# Patient Record
Sex: Female | Born: 1990 | Race: Asian | Hispanic: No | Marital: Married | Smoking: Never smoker
Health system: Southern US, Community
[De-identification: ages and names within clinical notes are randomized; demographics above are authoritative.]

## PROBLEM LIST (undated history)

## (undated) DIAGNOSIS — N946 Dysmenorrhea, unspecified: Secondary | ICD-10-CM

---

## 2021-08-21 ENCOUNTER — Encounter (HOSPITAL_BASED_OUTPATIENT_CLINIC_OR_DEPARTMENT_OTHER): Payer: Self-pay

## 2021-08-21 ENCOUNTER — Emergency Department (HOSPITAL_BASED_OUTPATIENT_CLINIC_OR_DEPARTMENT_OTHER): Payer: Commercial Managed Care - PPO

## 2021-08-21 ENCOUNTER — Emergency Department (HOSPITAL_BASED_OUTPATIENT_CLINIC_OR_DEPARTMENT_OTHER)
Admit: 2021-08-21 | Discharge: 2021-08-21 | Disposition: A | Discharge status: Home / Self Care | Payer: Commercial Managed Care - PPO | Attending: Emergency Medicine | Admitting: Emergency Medicine

## 2021-08-21 ENCOUNTER — Other Ambulatory Visit: Payer: Self-pay

## 2021-08-21 ENCOUNTER — Emergency Department (HOSPITAL_BASED_OUTPATIENT_CLINIC_OR_DEPARTMENT_OTHER)
Admission: EM | Admit: 2021-08-21 | Discharge: 2021-08-21 | Disposition: A | Discharge status: Home / Self Care | Payer: Commercial Managed Care - PPO | Attending: Emergency Medicine | Admitting: Emergency Medicine

## 2021-08-21 DIAGNOSIS — R109 Unspecified abdominal pain: Secondary | ICD-10-CM | POA: Diagnosis present

## 2021-08-21 DIAGNOSIS — M545 Low back pain, unspecified: Secondary | ICD-10-CM | POA: Insufficient documentation

## 2021-08-21 HISTORY — DX: Dysmenorrhea, unspecified: N94.6

## 2021-08-21 LAB — BASIC METABOLIC PANEL
Anion gap: 6 (ref 5–15)
BUN: 22 mg/dL — ABNORMAL HIGH (ref 6–20)
CO2: 23 mmol/L (ref 22–32)
Calcium: 8.9 mg/dL (ref 8.9–10.3)
Chloride: 106 mmol/L (ref 98–111)
Creatinine, Ser: 0.81 mg/dL (ref 0.44–1.00)
GFR, Estimated: >60 mL/min (ref >60)
Glucose, Bld: 102 mg/dL — ABNORMAL HIGH (ref 70–99)
Potassium: 3.5 mmol/L (ref 3.5–5.1)
Sodium: 135 mmol/L (ref 135–145)

## 2021-08-21 LAB — CBC WITH DIFFERENTIAL/PLATELET
Abs Immature Granulocytes: 0.02 10*3/uL (ref 0.00–0.07)
Basophils Absolute: 0 10*3/uL (ref 0.0–0.1)
Basophils Relative: 1 %
Eosinophils Absolute: 0.2 10*3/uL (ref 0.0–0.5)
Eosinophils Relative: 3 %
HCT: 37.1 % (ref 36.0–46.0)
Hemoglobin: 12.3 g/dL (ref 12.0–15.0)
Immature Granulocytes: 0 %
Lymphocytes Relative: 30 %
Lymphs Abs: 1.8 10*3/uL (ref 0.7–4.0)
MCH: 30 pg (ref 26.0–34.0)
MCHC: 33.2 g/dL (ref 30.0–36.0)
MCV: 90.5 fL (ref 80.0–100.0)
Monocytes Absolute: 0.4 10*3/uL (ref 0.1–1.0)
Monocytes Relative: 6 %
Neutro Abs: 3.6 10*3/uL (ref 1.7–7.7)
Neutrophils Relative %: 60 %
Platelets: 159 10*3/uL (ref 150–400)
RBC: 4.1 MIL/uL (ref 3.87–5.11)
RDW: 13 % (ref 11.5–15.5)
WBC: 6 10*3/uL (ref 4.0–10.5)
nRBC: 0 % (ref 0.0–0.2)

## 2021-08-21 LAB — URINALYSIS, ROUTINE W REFLEX MICROSCOPIC
Bilirubin Urine: NEGATIVE
Glucose, UA: NEGATIVE mg/dL
Ketones, ur: NEGATIVE mg/dL
Leukocytes,Ua: NEGATIVE
Nitrite: NEGATIVE
Protein, ur: NEGATIVE mg/dL
Specific Gravity, Urine: >=1.03 (ref 1.005–1.030)
pH: 6.5 (ref 5.0–8.0)

## 2021-08-21 LAB — URINALYSIS, MICROSCOPIC (REFLEX): RBC / HPF: >50 RBC/hpf (ref 0–5)

## 2021-08-21 LAB — PREGNANCY, URINE: Preg Test, Ur: NEGATIVE

## 2021-08-21 MED ORDER — OXYCODONE-ACETAMINOPHEN 5-325 MG PO TABS: 1.0000 | ORAL_TABLET | Freq: Three times a day (TID) | ORAL | 0 refills | Status: AC | PRN

## 2021-08-21 NOTE — ED Notes (Signed)
Pt in bed, sig other at bedside, pt states that she has no pain and is ready to go home, talked with pt and ultrasound, set up ultrasound appointment for 3pm today, pt verbalized understanding to return for ultrasound at 3.  Pt ambulatory from dpt with sig other.

## 2021-08-21 NOTE — ED Provider Notes (Signed)
[facility] EMERGENCY DEPARTMENT Provider Note   CSN: [id number] Arrival date & time: 08/21/21  0458     History  Chief Complaint  Patient presents with   Abdominal Pain    LLQ   Flank Pain    Left    Glenda Decker is a 31 y.o. female.  31 yo F who presents to the emergency department tonight secondary to left lower back pain that radiates around to her stomach.  Patient states that this happened on Tuesday.  She states for about 40 minutes she had to rest until the pain went away.  She took ibuprofen during that time which seemed to help.  She was fine since that time.  She did start her menstrual cycle a day prior to that and has been heavier than normal.  She called her gynecologic nurse practitioner who set up an ultrasound for the beginning of March.  Patient states that tonight around 230 the pain returned and she took 3 ibuprofen again which ultimately helped but she was still having pain for couple hours.  She stated it was severe.  No other vaginal changes.  No urinary changes.  No history of kidney stones.  No trauma.  No musculoskeletal injuries that she knows of.  She does state that sometimes it would radiate down her leg but not consistently.  It is a pinching type feeling.   Abdominal Pain Flank Pain Associated symptoms include abdominal pain.      Home Medications Prior to Admission medications   Medication Sig Start Date End Date Taking? Authorizing Provider  oxyCODONE-acetaminophen (PERCOCET) 5-325 MG tablet Take 1 tablet by mouth every 8 (eight) hours as needed. 08/21/21  Yes Yadira Hada, Barbara Cower, MD      Allergies    Patient has no known allergies.    Review of Systems   Review of Systems  Gastrointestinal:  Positive for abdominal pain.  Genitourinary:  Positive for flank pain.   Physical Exam Updated Vital Signs BP 97/73    Pulse 62    Temp 98.3 F (36.8 C) (Oral)    Resp 18    Ht 5\' 1"  (1.549 m)    Wt 59 kg    LMP 08/18/2021    SpO2 99%    BMI  24.56 kg/m  Physical Exam Vitals and nursing note reviewed.  Constitutional:      Appearance: She is well-developed.  HENT:     Head: Normocephalic and atraumatic.     Mouth/Throat:     Mouth: Mucous membranes are moist.     Pharynx: Oropharynx is clear.  Eyes:     Pupils: Pupils are equal, round, and reactive to light.  Cardiovascular:     Rate and Rhythm: Normal rate and regular rhythm.  Pulmonary:     Effort: No respiratory distress.     Breath sounds: No stridor.  Abdominal:     General: Abdomen is flat. There is no distension.     Tenderness: There is no abdominal tenderness.  Musculoskeletal:        General: No swelling or tenderness. Normal range of motion.     Cervical back: Normal range of motion.  Skin:    General: Skin is warm and dry.  Neurological:     General: No focal deficit present.     Mental Status: She is alert.    ED Results / Procedures / Treatments   Labs (all labs ordered are listed, but only abnormal results are displayed) Labs Reviewed  URINALYSIS,  ROUTINE W REFLEX MICROSCOPIC - Abnormal; Notable for the following components:      Result Value   APPearance CLOUDY (*)    Hgb urine dipstick LARGE (*)    All other components within normal limits  URINALYSIS, MICROSCOPIC (REFLEX) - Abnormal; Notable for the following components:   Bacteria, UA RARE (*)    All other components within normal limits  BASIC METABOLIC PANEL - Abnormal; Notable for the following components:   Glucose, Bld 102 (*)    BUN 22 (*)    All other components within normal limits  URINE CULTURE  PREGNANCY, URINE  CBC WITH DIFFERENTIAL/PLATELET    EKG None  Radiology CT Renal Stone Study  Result Date: 08/21/2021 CLINICAL DATA:  31 year old female with history of left lower quadrant abdominal pain and flank pain. EXAM: CT ABDOMEN AND PELVIS WITHOUT CONTRAST TECHNIQUE: Multidetector CT imaging of the abdomen and pelvis was performed following the standard protocol without  IV contrast. RADIATION DOSE REDUCTION: This exam was performed according to the departmental dose-optimization program which includes automated exposure control, adjustment of the mA and/or kV according to patient size and/or use of iterative reconstruction technique. COMPARISON:  No priors. FINDINGS: Lower chest: Bilateral breast implants are incidentally noted. Hepatobiliary: No suspicious cystic or solid hepatic lesions are confidently identified on today's noncontrast CT examination. Unenhanced appearance of the gallbladder is normal. Pancreas: No definite pancreatic mass or peripancreatic fluid collections or inflammatory changes are noted on today's noncontrast CT examination. Spleen: Unremarkable. Adrenals/Urinary Tract: There are no abnormal calcifications within the collecting system of either kidney, along the course of either ureter, or within the lumen of the urinary bladder. No hydroureteronephrosis or perinephric stranding to suggest urinary tract obstruction at this time. The unenhanced appearance of the kidneys is unremarkable bilaterally. Unenhanced appearance of the urinary bladder is normal. Bilateral adrenal glands are normal in appearance. Stomach/Bowel: Unenhanced appearance of the stomach is normal. There is no pathologic dilatation of small bowel or colon. Normal appendix. Vascular/Lymphatic: No atherosclerotic calcifications are noted in the abdominal aorta or pelvic vasculature. No lymphadenopathy noted in the abdomen or pelvis. Reproductive: Unenhanced appearance of the uterus and ovaries is unremarkable. Other: Trace amount of free fluid in the cul-de-sac, presumably physiologic in this young female patient. No larger volume of ascites. No pneumoperitoneum. Musculoskeletal: There are no aggressive appearing lytic or blastic lesions noted in the visualized portions of the skeleton. IMPRESSION: 1. No acute findings are noted in the abdomen or pelvis to account for the patient's symptoms. 2.  Trace volume of free fluid in the cul-de-sac, presumably physiologic in this young female patient. Electronically Signed   By: Trudie Reed M.D.   On: 08/21/2021 06:51    Procedures Procedures    Medications Ordered in ED Medications - No data to display  ED Course/ Medical Decision Making/ A&P                           Medical Decision Making Amount and/or Complexity of Data Reviewed Labs: ordered. Radiology: ordered. ECG/medicine tests: ordered.  Risk Prescription drug management.   Initial concern for kidney stone versus ovarian cyst versus sciatica.  Urine appears to be slightly contaminated with her menstrual blood, culture sent.  Labs unremarkable.  CT scan without any evidence of stone, musculoskeletal causes.  Patient's symptoms are controlled this time.  Doubt ovarian torsion although I guess it could be intermittent.  Could be a small cyst not seen on the  CT or possibly when it is already ruptured.  Could be muscular as well.  Also still could be sciatica.  We will treat conservatively at home and return for an ultrasound to evaluate for cyst otherwise follow-up with her outpatient doctors.  Final Clinical Impression(s) / ED Diagnoses Final diagnoses:  Flank pain    Rx / DC Orders ED Discharge Orders          Ordered    oxyCODONE-acetaminophen (PERCOCET) 5-325 MG tablet  Every 8 hours PRN        08/21/21 0707    US PELVIC COMPLETE W TRANSVAGINAL AND TORSION R/O        08/21/21 0708              Cledith Kamiya, Barbara Cower, MD 08/21/21 [phone]

## 2021-08-21 NOTE — ED Triage Notes (Signed)
Patient presents with complaint of LLQ pain and L flank pain which began around 0230 this morning.  Patient took 3 advil without relief of symptoms.  Denies nausea/vomiting.  Patient having light menstrual bleeding which began on Tuesday.

## 2021-08-22 LAB — URINE CULTURE: Culture: <10000 — AB

## 2023-02-13 IMAGING — CT CT RENAL STONE PROTOCOL
2 of 4 series · 16 of 46 positions shown, 18 images · non-contrast
Comparison: No priors.

CLINICAL DATA: 30-year-old female with history of left lower
quadrant abdominal pain and flank pain.



[Series 2: axial st · axial · 0.84mm/px · z∈[+639,+1049]mm · 13 of 90 slices shown, 15 images]
[im 4/90  soft-tissue]
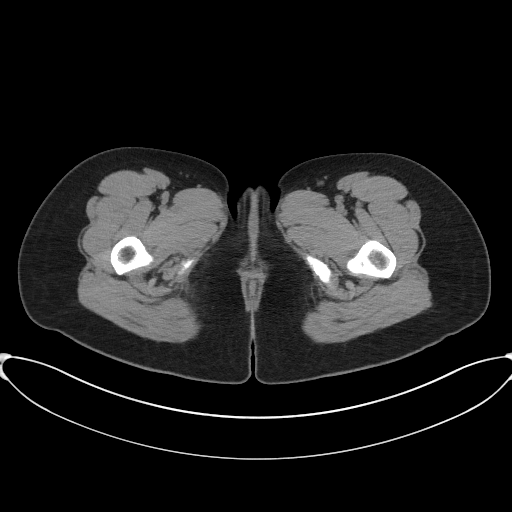
[im 4/90  bone]
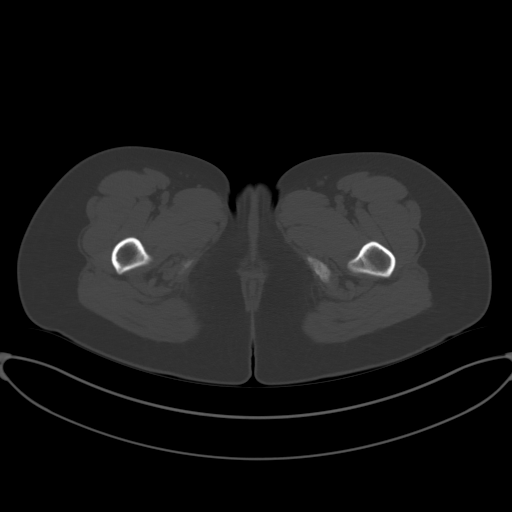
[im 11/90  soft-tissue]
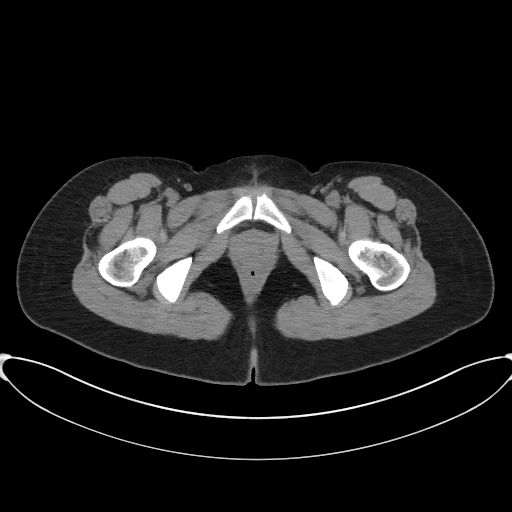
[im 18/90  soft-tissue]
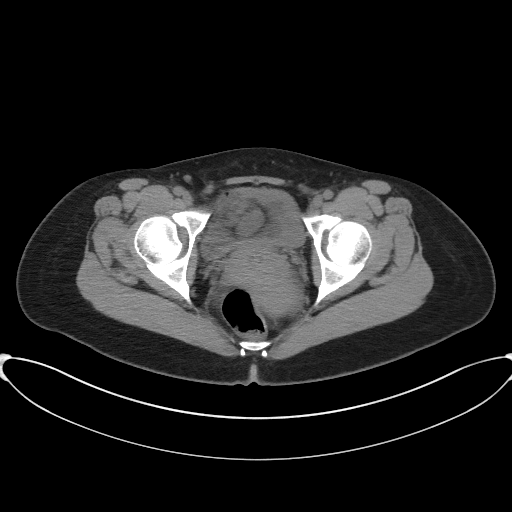
[im 25/90  soft-tissue]
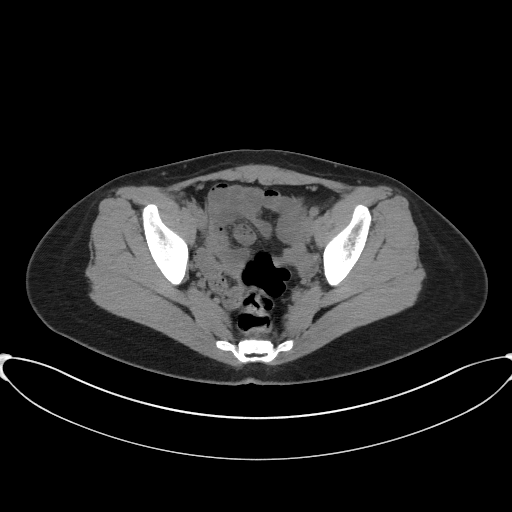
[im 33/90  soft-tissue]
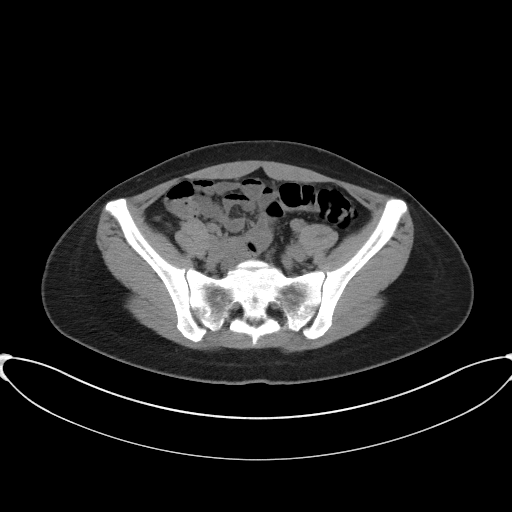
[im 40/90  soft-tissue]
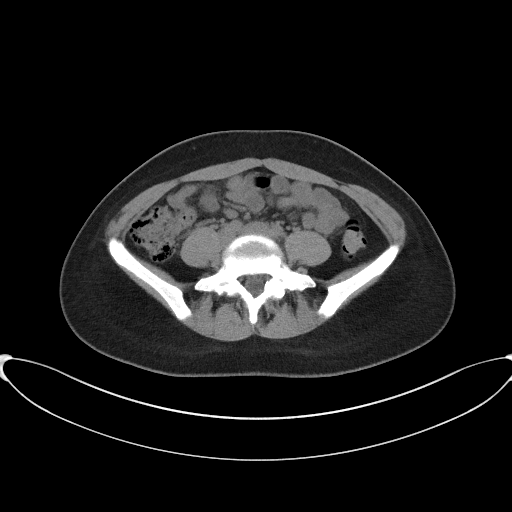
[im 47/90  soft-tissue]
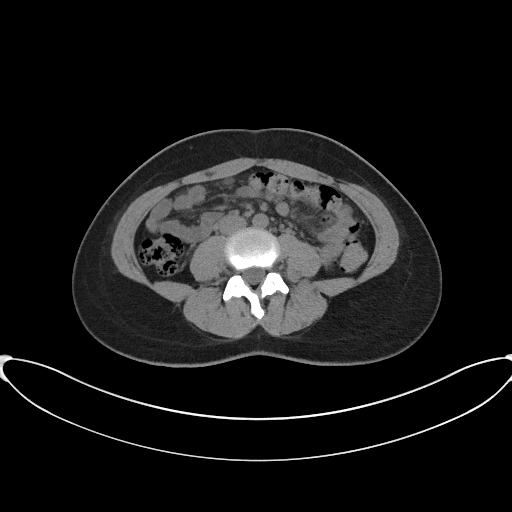
[im 50/90  soft-tissue]
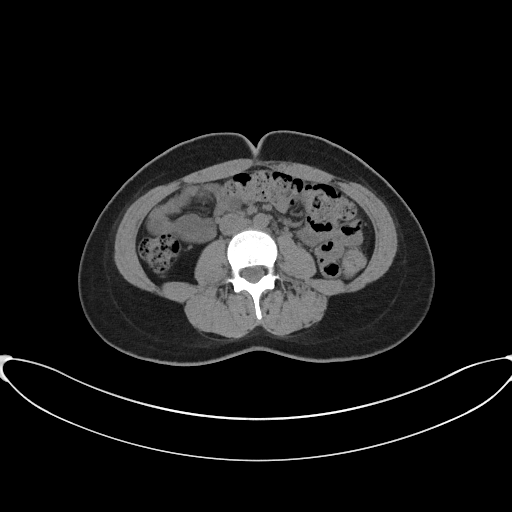
[im 57/90  soft-tissue]
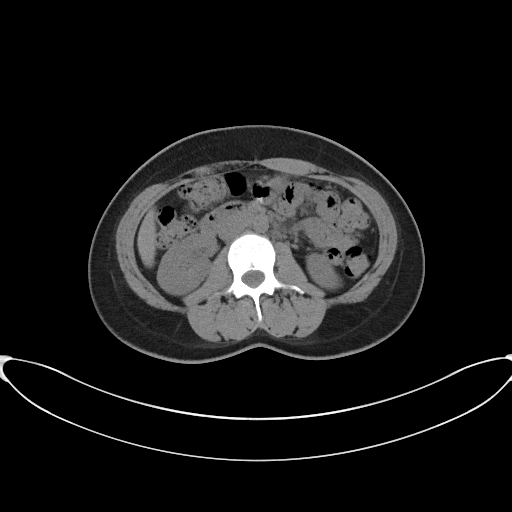
[im 57/90  bone]
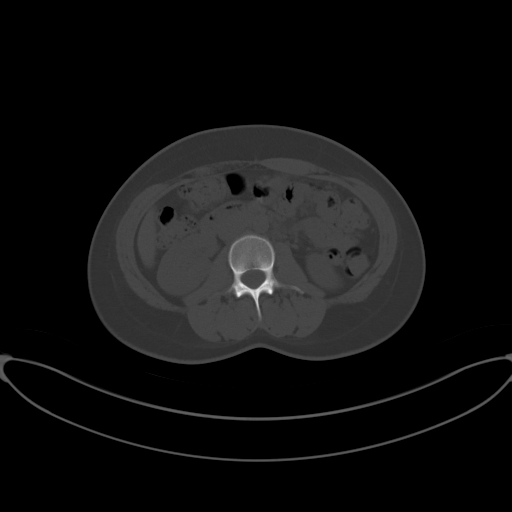
[im 65/90  soft-tissue]
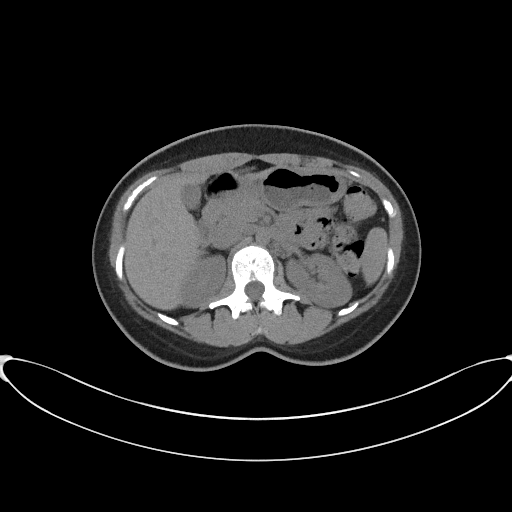
[im 72/90  soft-tissue]
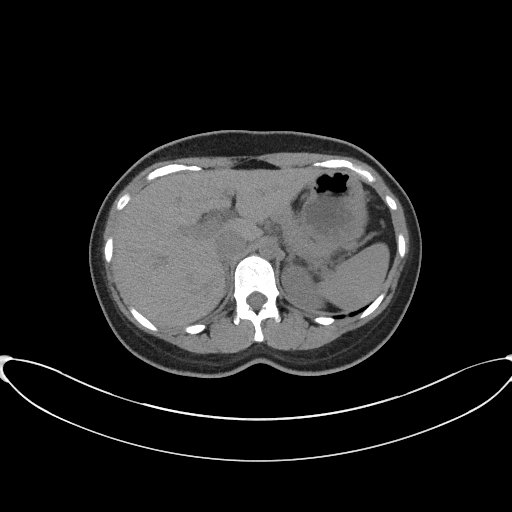
[im 79/90  soft-tissue]
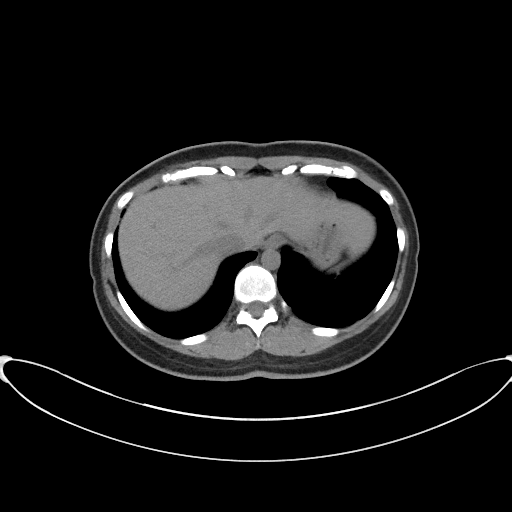
[im 86/90  soft-tissue]
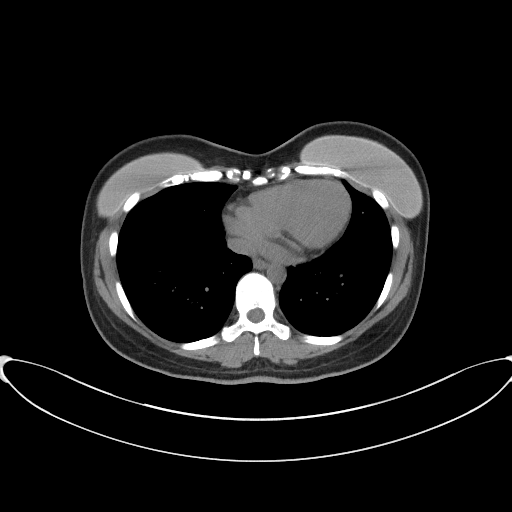

[Series 5: coronal st · coronal · 0.71mm/px · 3 of 68 slices shown]
[im 23/68  soft-tissue]
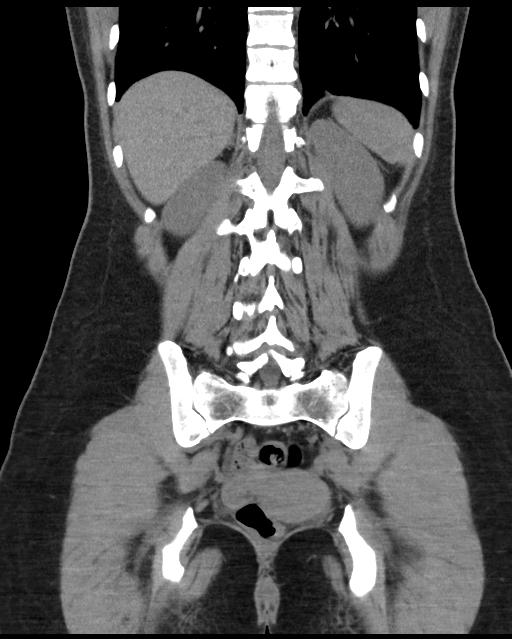
[im 30/68  soft-tissue]
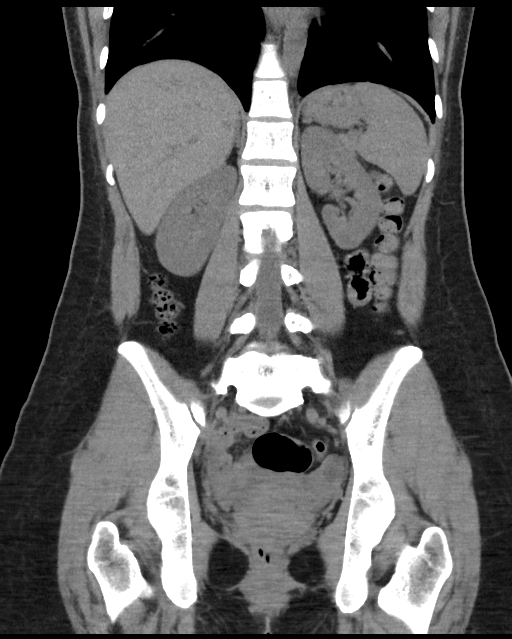
[im 38/68  soft-tissue]
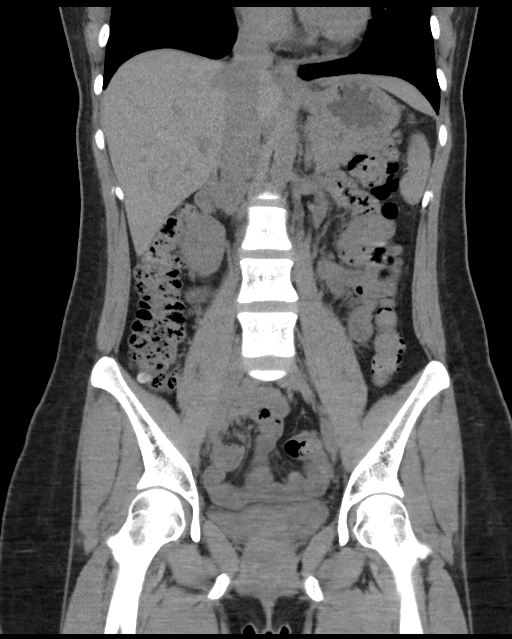

[16 of 46 positions shown; findings below may reference images not displayed]

FINDINGS: Lower chest: Bilateral breast implants are incidentally noted.

Hepatobiliary: No suspicious cystic or solid hepatic lesions are
confidently identified on today's noncontrast CT examination.
Unenhanced appearance of the gallbladder is normal.

Pancreas: No definite pancreatic mass or peripancreatic fluid
collections or inflammatory changes are noted on today's noncontrast
CT examination.

Spleen: Unremarkable.

Adrenals/Urinary Tract: There are no abnormal calcifications within
the collecting system of either kidney, along the course of either
ureter, or within the lumen of the urinary bladder. No
hydroureteronephrosis or perinephric stranding to suggest urinary
tract obstruction at this time. The unenhanced appearance of the
kidneys is unremarkable bilaterally. Unenhanced appearance of the
urinary bladder is normal. Bilateral adrenal glands are normal in
appearance.

Stomach/Bowel: Unenhanced appearance of the stomach is normal. There
is no pathologic dilatation of small bowel or colon. Normal
appendix.

Vascular/Lymphatic: No atherosclerotic calcifications are noted in
the abdominal aorta or pelvic vasculature. No lymphadenopathy noted
in the abdomen or pelvis.

Reproductive: Unenhanced appearance of the uterus and ovaries is
unremarkable.

Other: Trace amount of free fluid in the cul-de-sac, presumably
physiologic in this young female patient. No larger volume of
ascites. No pneumoperitoneum.

Musculoskeletal: There are no aggressive appearing lytic or blastic
lesions noted in the visualized portions of the skeleton.
IMPRESSION: 1. No acute findings are noted in the abdomen or pelvis to account
for the patient's symptoms.
2. Trace volume of free fluid in the cul-de-sac, presumably
physiologic in this young female patient.

## 2023-02-13 IMAGING — US US PELVIS COMPLETE TRANSABD/TRANSVAG W DUPLEX AND/OR DOPPLER
1 series · 13 of 25 positions shown · non-contrast
Comparison: CT abdomen and pelvis 08/21/2021

CLINICAL DATA: LEFT flank and pelvic pain, LMP 08/18/2021

EXAM:
TRANSABDOMINAL AND TRANSVAGINAL ULTRASOUND OF PELVIS
DOPPLER ULTRASOUND OF OVARIES
TECHNIQUE: Both transabdominal and transvaginal ultrasound examinations of the
pelvis were performed. Transabdominal technique was performed for
global imaging of the pelvis including uterus, ovaries, adnexal
regions, and pelvic cul-de-sac.
It was necessary to proceed with endovaginal exam following the
transabdominal exam to visualize the uterus, endometrium, and
ovaries. Color and duplex Doppler ultrasound was utilized to
evaluate blood flow to the ovaries.

[Series 1: us pelvis complete transabd/transvag w duplex and/ · 13 of 112 slices shown]
[im 1/112]
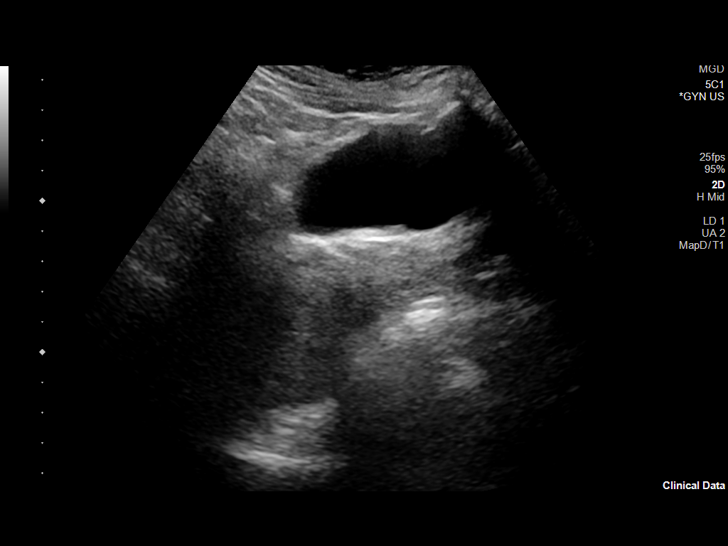
[im 10/112]
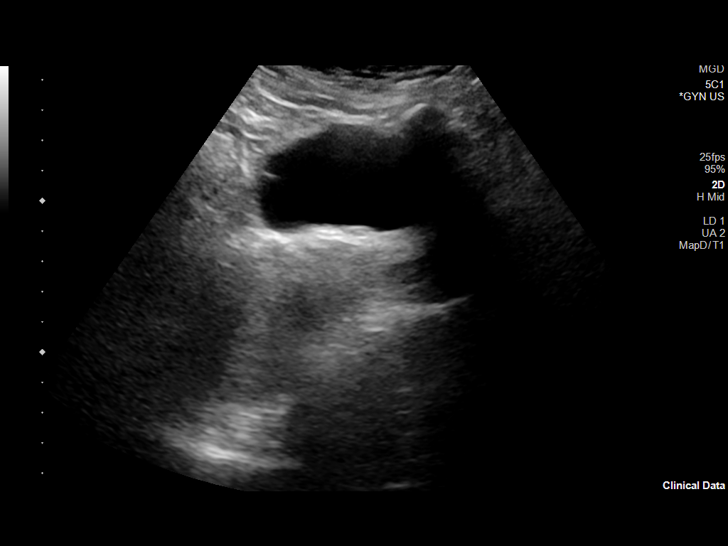
[im 19/112]
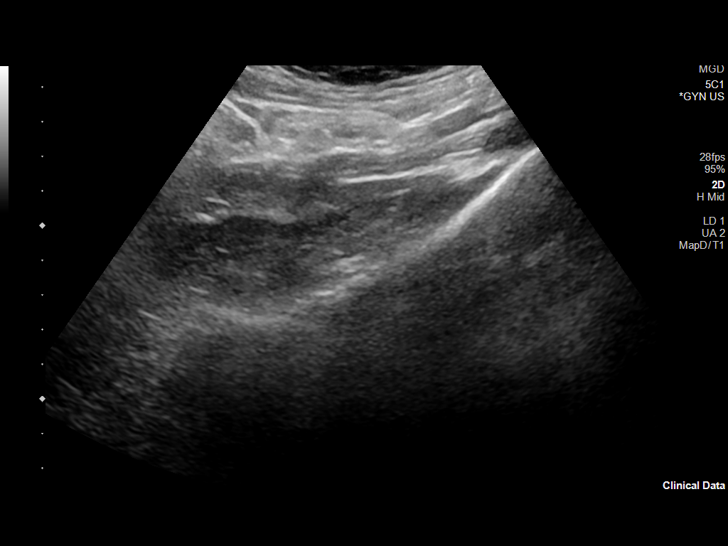
[im 28/112]
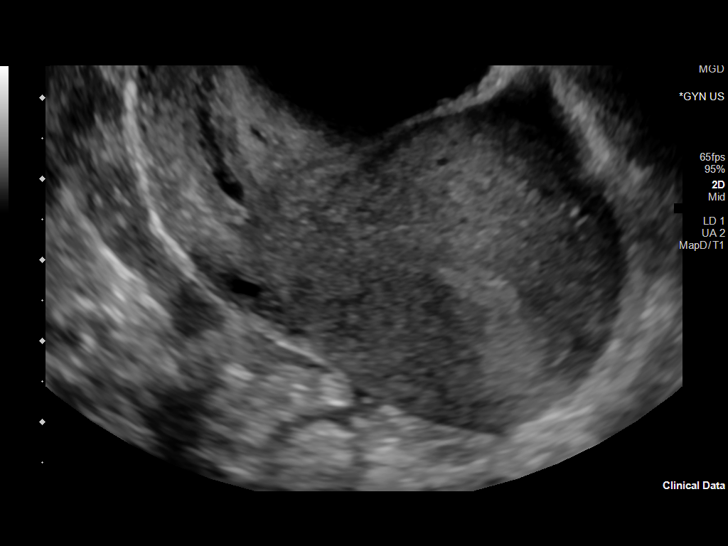
[im 38/112]
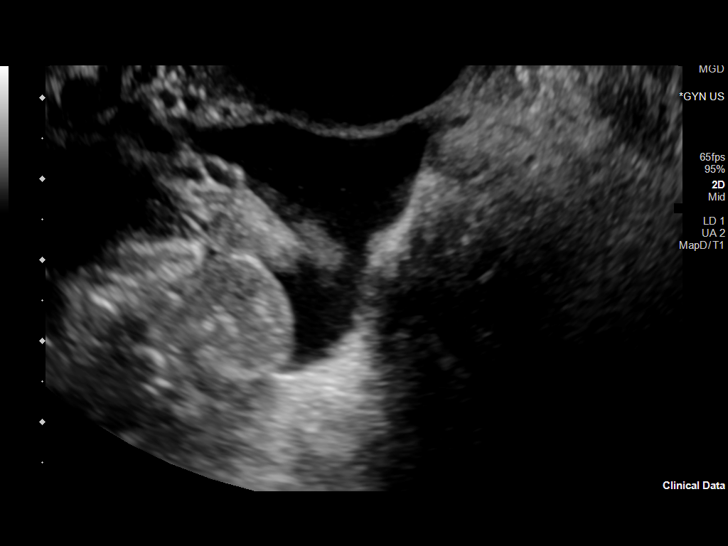
[im 47/112]
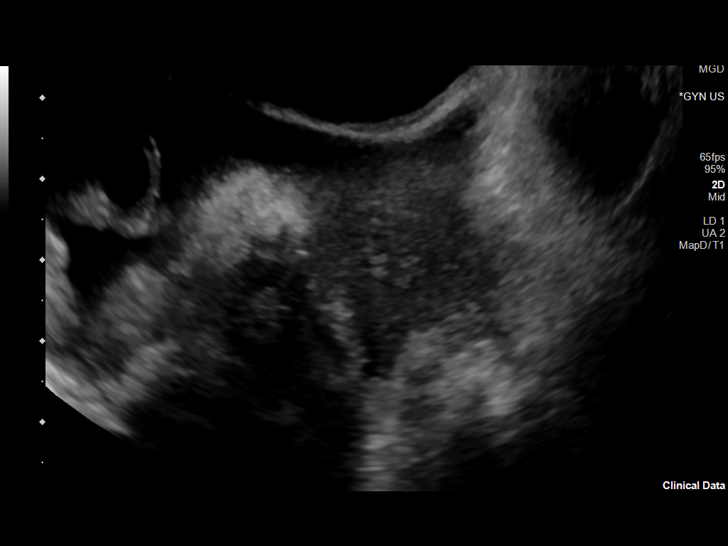
[im 56/112]
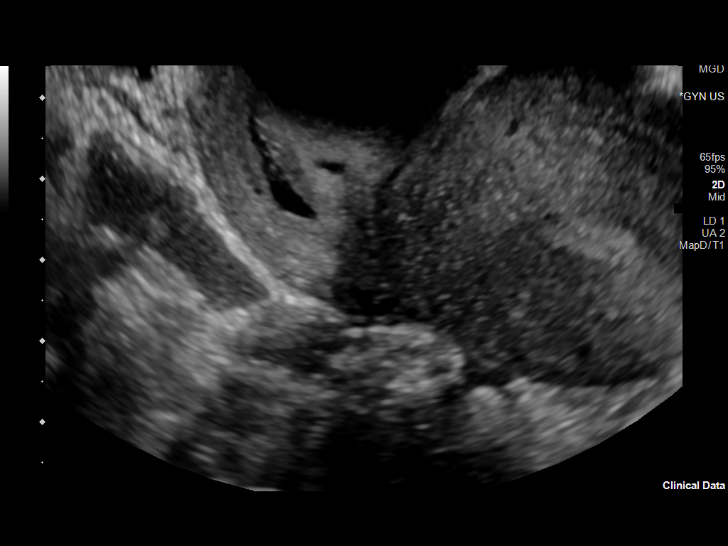
[im 65/112]
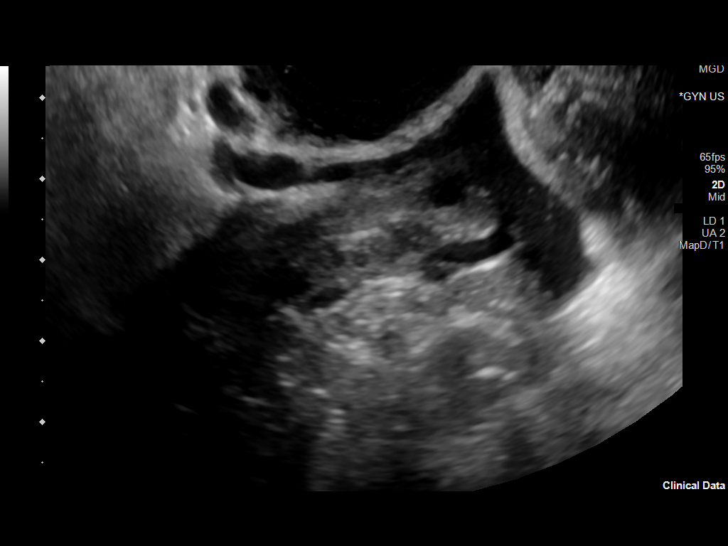
[im 75/112]
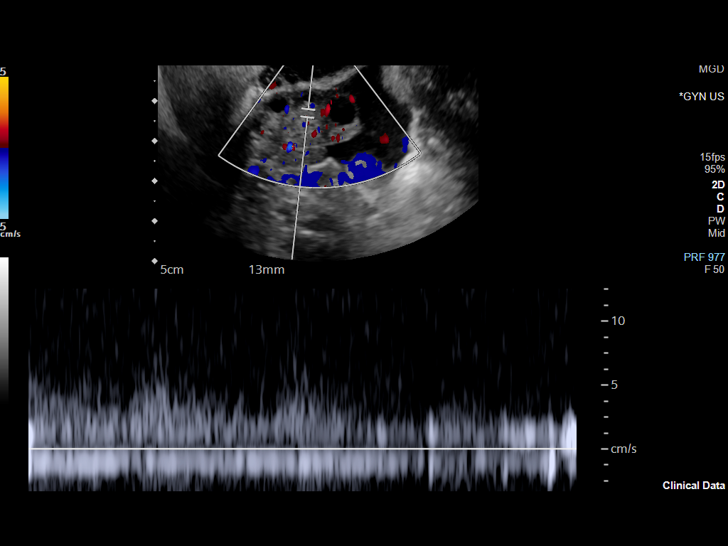
[im 84/112]
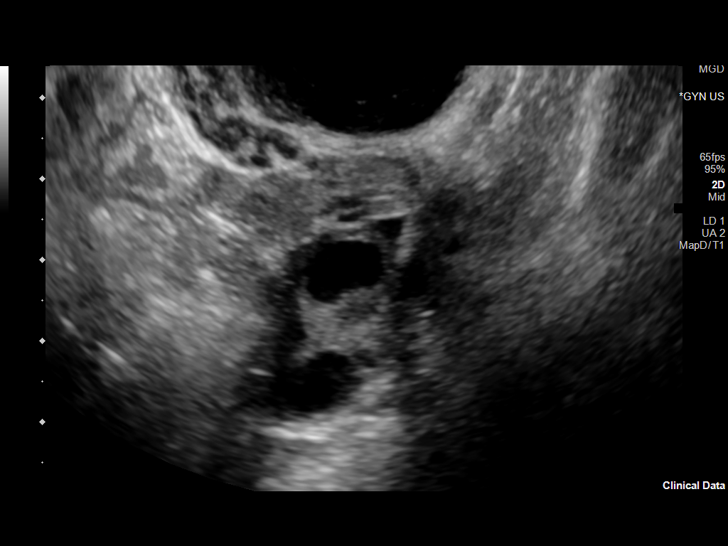
[im 93/112]
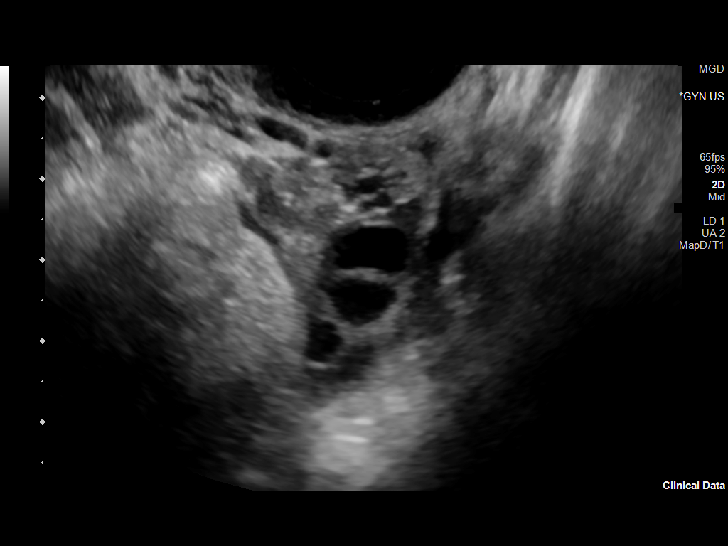
[im 102/112]
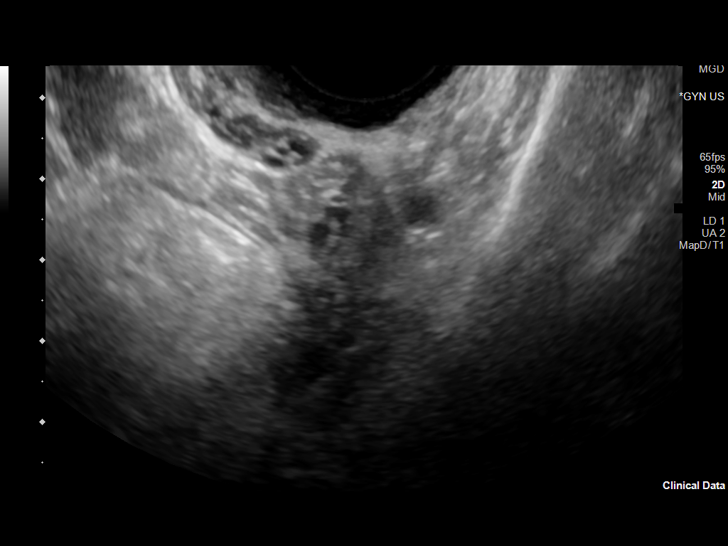
[im 112/112]
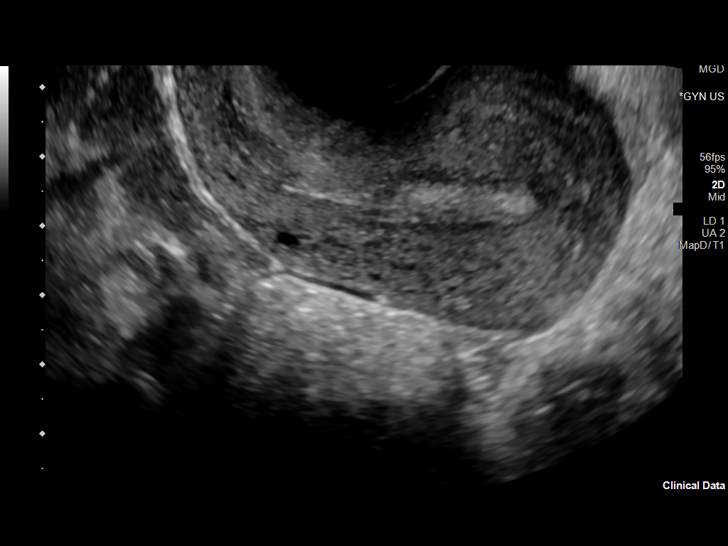

[13 of 25 positions shown; findings below may reference images not displayed]

FINDINGS: Uterus

Measurements: 6.4 x 3.7 x 4.7 cm = volume: 59 mL. Retroverted.
Normal morphology without mass

Endometrium

Thickness: 5 mm. No endometrial fluid or mass. Small amount of
nonspecific endocervical canal fluid noted.

Right ovary

Measurements: 2.9 x 1.6 x 2.5 cm = volume: 6.5 mL. Normal morphology
without mass. Internal blood flow present on color Doppler imaging.

Left ovary

Measurements: 2.4 x 1.7 x 1.5 cm = volume: 3.5 mL. Normal morphology
without mass. Internal blood flow present on color Doppler imaging.

Pulsed Doppler evaluation of both ovaries demonstrates low
resistance arterial and venous waveforms in both ovaries.

Other findings

Small amount of nonspecific free pelvic fluid.  No adnexal masses
IMPRESSION: Small amount of nonspecific free pelvic fluid.

Otherwise negative exam.

No evidence of ovarian mass or torsion.
